# Patient Record
Sex: Female | Born: 2000 | Race: Black or African American | Hispanic: No | Marital: Single | Smoking: Never smoker
Health system: Southern US, Community
[De-identification: ages and names within clinical notes are randomized; demographics above are authoritative.]

## PROBLEM LIST (undated history)

## (undated) DIAGNOSIS — J189 Pneumonia, unspecified organism: Secondary | ICD-10-CM

## (undated) DIAGNOSIS — E876 Hypokalemia: Secondary | ICD-10-CM

---
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
## 2022-02-14 ENCOUNTER — Ambulatory Visit
Admission: RE | Admit: 2022-02-14 | Discharge: 2022-02-14 | Disposition: A | Discharge status: Home / Self Care | Payer: Medicaid Other | Source: Ambulatory Visit | Attending: Physician Assistant | Admitting: Physician Assistant

## 2022-02-14 VITALS — BP 103/70 | HR 83 | Temp 98.3°F | Resp 16

## 2022-02-14 DIAGNOSIS — N3000 Acute cystitis without hematuria: Secondary | ICD-10-CM | POA: Insufficient documentation

## 2022-02-14 DIAGNOSIS — N898 Other specified noninflammatory disorders of vagina: Secondary | ICD-10-CM

## 2022-02-14 LAB — POCT URINALYSIS DIP (MANUAL ENTRY)
Bilirubin, UA: NEGATIVE
Glucose, UA: NEGATIVE mg/dL
Ketones, POC UA: NEGATIVE mg/dL
Nitrite, UA: NEGATIVE
Protein Ur, POC: NEGATIVE mg/dL
Spec Grav, UA: 1.025 (ref 1.010–1.025)
Urobilinogen, UA: 1 E.U./dL
pH, UA: 7.5 (ref 5.0–8.0)

## 2022-02-14 LAB — POCT URINE PREGNANCY: Preg Test, Ur: NEGATIVE

## 2022-02-14 MED ORDER — NITROFURANTOIN MONOHYD MACRO 100 MG PO CAPS: 100.0000 mg | ORAL_CAPSULE | Freq: Two times a day (BID) | ORAL | 0 refills | Status: DC

## 2022-02-14 NOTE — ED Triage Notes (Signed)
Pt c/o vaginal discharge, frequency, itching   Onset ~ thurs

## 2022-02-14 NOTE — ED Provider Notes (Signed)
[facility]    CSN: [id number] Arrival date & time: 02/14/22  1417      History   Chief Complaint Chief Complaint  Patient presents with   Vaginal Itching    Entered by patient    HPI Christine Gray is a 22 y.o. female.   Patient here today for evaluation of discomfort with urination, urinary frequency, vaginal itching that started 4 days ago. She has not had any abdominal pain, nausea or vomiting but did think she has had fever on occasion. She does not report treatment for symptoms.   The history is provided by the patient.  Vaginal Itching Pertinent negatives include no abdominal pain.    Past Medical History:  Diagnosis Date   Hypokalemia    Pneumonia    Suicidal ideation 09/19/2021   Tylenol overdose 09/19/2021    Patient Active Problem List   Diagnosis Date Noted   MDD (major depressive disorder), recurrent severe, without psychosis ([place])    Tylenol overdose 09/19/2021   Suicidal ideation 09/19/2021   Hypokalemia     History reviewed. No pertinent surgical history.  OB History   No obstetric history on file.      Home Medications    Prior to Admission medications   Medication Sig Start Date End Date Taking? Authorizing Provider  nitrofurantoin, macrocrystal-monohydrate, (MACROBID) 100 MG capsule Take 1 capsule (100 mg total) by mouth 2 (two) times daily. 02/14/22   Francene Finders, PA-C  triamcinolone cream (KENALOG) 0.1 % Apply 1 Application topically daily as needed (Eczema).    [provider]    Family History Family History  Family history unknown: Yes    Social History Social History   Tobacco Use   Smoking status: Never    Passive exposure: Yes  Substance Use Topics   Alcohol use: No   Drug use: No     Allergies   Strawberry (diagnostic)   Review of Systems Review of Systems  Constitutional:  Positive for fever (subjective). Negative for chills.  Eyes:  Negative for discharge and redness.   Gastrointestinal:  Negative for abdominal pain, nausea and vomiting.  Genitourinary:  Positive for dysuria and frequency. Negative for vaginal discharge.     Physical Exam Triage Vital Signs ED Triage Vitals [02/14/22 1426]  Enc Vitals Group     BP 103/70     Pulse Rate 83     Resp 16     Temp 98.3 F (36.8 C)     Temp Source Oral     SpO2 98 %     Weight      Height      Head Circumference      Peak Flow      Pain Score 0     Pain Loc      Pain Edu?      Excl. in [place]?    No data found.  Updated Vital Signs BP 103/70 (BP Location: Left Arm)   Pulse 83   Temp 98.3 F (36.8 C) (Oral)   Resp 16   SpO2 98%      Physical Exam Vitals and nursing note reviewed.  Constitutional:      General: She is not in acute distress.    Appearance: Normal appearance. She is not ill-appearing.  HENT:     Head: Normocephalic and atraumatic.  Eyes:     Conjunctiva/sclera: Conjunctivae normal.  Cardiovascular:     Rate and Rhythm: Normal rate.  Pulmonary:     Effort:  Pulmonary effort is normal. No respiratory distress.  Neurological:     Mental Status: She is alert.  Psychiatric:        Mood and Affect: Mood normal.        Behavior: Behavior normal.        Thought Content: Thought content normal.      UC Treatments / Results  Labs (all labs ordered are listed, but only abnormal results are displayed) Labs Reviewed  POCT URINALYSIS DIP (MANUAL ENTRY) - Abnormal; Notable for the following components:      Result Value   Color, UA straw (*)    Clarity, UA cloudy (*)    Blood, UA small (*)    Leukocytes, UA Large (3+) (*)    All other components within normal limits  URINE CULTURE  POCT URINE PREGNANCY  CERVICOVAGINAL ANCILLARY ONLY    EKG   Radiology No results found.  Procedures Procedures (including critical care time)  Medications Ordered in UC Medications - No data to display  Initial Impression / Assessment and Plan / UC Course  I have reviewed the  triage vital signs and the nursing notes.  Pertinent labs & imaging results that were available during my care of the patient were reviewed by me and considered in my medical decision making (see chart for details).    UA concerning for UTI- will order urine culture and macrobid prescribed. Screening for STD, yeast and BV also ordered given vaginal symptoms. Urine pregnancy test negative. Will await results for further recommendation but did encourage follow up with any worsening or persistent symptoms.   Final Clinical Impressions(s) / UC Diagnoses   Final diagnoses:  Acute cystitis without hematuria  Vaginal itching   Discharge Instructions   None    ED Prescriptions     Medication Sig Dispense Auth. Provider   nitrofurantoin, macrocrystal-monohydrate, (MACROBID) 100 MG capsule  (Status: Discontinued) Take 1 capsule (100 mg total) by mouth 2 (two) times daily. 10 capsule Francene Finders, PA-C   nitrofurantoin, macrocrystal-monohydrate, (MACROBID) 100 MG capsule Take 1 capsule (100 mg total) by mouth 2 (two) times daily. 10 capsule Francene Finders, PA-C      PDMP not reviewed this encounter.   Francene Finders, PA-C 02/14/22 1600

## 2022-02-15 ENCOUNTER — Telehealth (HOSPITAL_COMMUNITY): Payer: Self-pay | Admitting: Emergency Medicine

## 2022-02-15 LAB — CERVICOVAGINAL ANCILLARY ONLY
Bacterial Vaginitis (gardnerella): POSITIVE — AB
Candida Glabrata: NEGATIVE
Candida Vaginitis: POSITIVE — AB
Chlamydia: NEGATIVE
Comment: NEGATIVE
Comment: NEGATIVE
Comment: NEGATIVE
Comment: NEGATIVE
Comment: NEGATIVE
Comment: NORMAL
Neisseria Gonorrhea: NEGATIVE
Trichomonas: NEGATIVE

## 2022-02-15 LAB — URINE CULTURE: Culture: 40000 — AB

## 2022-02-15 MED ORDER — METRONIDAZOLE 500 MG PO TABS: 500.0000 mg | ORAL_TABLET | Freq: Two times a day (BID) | ORAL | 0 refills | Status: DC

## 2022-02-15 MED ORDER — FLUCONAZOLE 150 MG PO TABS: 150.0000 mg | ORAL_TABLET | Freq: Once | ORAL | 0 refills | Status: AC

## 2023-02-03 ENCOUNTER — Ambulatory Visit: Payer: Self-pay

## 2023-03-27 ENCOUNTER — Other Ambulatory Visit: Payer: Self-pay

## 2023-03-27 ENCOUNTER — Emergency Department (HOSPITAL_COMMUNITY): Payer: MEDICAID

## 2023-03-27 ENCOUNTER — Encounter (HOSPITAL_COMMUNITY): Payer: Self-pay | Admitting: Emergency Medicine

## 2023-03-27 ENCOUNTER — Emergency Department (HOSPITAL_COMMUNITY)
Admission: EM | Admit: 2023-03-27 | Discharge: 2023-03-27 | Disposition: A | Discharge status: Home / Self Care | Payer: MEDICAID | Attending: Emergency Medicine | Admitting: Emergency Medicine

## 2023-03-27 DIAGNOSIS — G43009 Migraine without aura, not intractable, without status migrainosus: Secondary | ICD-10-CM | POA: Insufficient documentation

## 2023-03-27 DIAGNOSIS — R519 Headache, unspecified: Secondary | ICD-10-CM | POA: Diagnosis present

## 2023-03-27 LAB — CBC WITH DIFFERENTIAL/PLATELET
Abs Immature Granulocytes: 0.02 10*3/uL (ref 0.00–0.07)
Basophils Absolute: 0.1 10*3/uL (ref 0.0–0.1)
Basophils Relative: 1 %
Eosinophils Absolute: 0 10*3/uL (ref 0.0–0.5)
Eosinophils Relative: 1 %
HCT: 40.2 % (ref 36.0–46.0)
Hemoglobin: 14 g/dL (ref 12.0–15.0)
Immature Granulocytes: 0 %
Lymphocytes Relative: 27 %
Lymphs Abs: 1.9 10*3/uL (ref 0.7–4.0)
MCH: 30.8 pg (ref 26.0–34.0)
MCHC: 34.8 g/dL (ref 30.0–36.0)
MCV: 88.4 fL (ref 80.0–100.0)
Monocytes Absolute: 0.4 10*3/uL (ref 0.1–1.0)
Monocytes Relative: 6 %
Neutro Abs: 4.5 10*3/uL (ref 1.7–7.7)
Neutrophils Relative %: 65 %
Platelets: 326 10*3/uL (ref 150–400)
RBC: 4.55 MIL/uL (ref 3.87–5.11)
RDW: 13.2 % (ref 11.5–15.5)
WBC: 7 10*3/uL (ref 4.0–10.5)
nRBC: 0 % (ref 0.0–0.2)

## 2023-03-27 LAB — HCG, SERUM, QUALITATIVE: Preg, Serum: NEGATIVE

## 2023-03-27 LAB — BASIC METABOLIC PANEL
Anion gap: 11 (ref 5–15)
BUN: 11 mg/dL (ref 6–20)
CO2: 22 mmol/L (ref 22–32)
Calcium: 9.6 mg/dL (ref 8.9–10.3)
Chloride: 104 mmol/L (ref 98–111)
Creatinine, Ser: 0.72 mg/dL (ref 0.44–1.00)
GFR, Estimated: >60 mL/min (ref >60)
Glucose, Bld: 83 mg/dL (ref 70–99)
Potassium: 3.6 mmol/L (ref 3.5–5.1)
Sodium: 137 mmol/L (ref 135–145)

## 2023-03-27 MED ORDER — DROPERIDOL 2.5 MG/ML IJ SOLN
2.5000 mg | Freq: Once | INTRAMUSCULAR | Status: AC
  Administered 2023-03-27: 2.5 mg via INTRAVENOUS
  Filled 2023-03-27: qty 2
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
## 2023-03-27 NOTE — Discharge Instructions (Addendum)
 While you are in the emergency room he had blood work done that was normal.  You do CT scan done of your head that was also normal.  Please follow-up with your primary care doctor to discuss further management of headache.
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
## 2023-06-21 ENCOUNTER — Ambulatory Visit (HOSPITAL_COMMUNITY)
Admission: EM | Admit: 2023-06-21 | Discharge: 2023-06-21 | Disposition: A | Discharge status: Home / Self Care | Payer: MEDICAID | Attending: Internal Medicine | Admitting: Internal Medicine

## 2023-06-21 ENCOUNTER — Encounter (HOSPITAL_COMMUNITY): Payer: Self-pay

## 2023-06-21 DIAGNOSIS — M19042 Primary osteoarthritis, left hand: Secondary | ICD-10-CM | POA: Diagnosis not present

## 2023-06-21 MED ORDER — PREDNISONE 10 MG PO TABS: ORAL_TABLET | ORAL | 0 refills | Status: AC

## 2023-06-21 NOTE — ED Provider Notes (Signed)
 [facility]    CSN: [id number] Arrival date & time: 06/21/23  1952      History   Chief Complaint Chief Complaint  Patient presents with   Hand Pain    Left index finger    HPI Christine Gray is a 23 y.o. female.   22 y.o. female who presents to urgent care with complaints of left index finger pain and swelling.  This started this morning.  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  She woke up this morning with decreased range of motion, pain and swelling in the left index finger.  There is no color change or warmth to the area.  She has not had any injury to the area.  She has had no previous issues with this finger in the past.  She is right-handed.  She denies fevers or chills.  She can only partially bend her finger at the proximal joint.  Most the pain is isolated to the mid to distal finger.  She denies any history of gout.    Hand Pain Pertinent negatives include no chest pain, no abdominal pain and no shortness of breath.    Past Medical History:  Diagnosis Date   Hypokalemia    Pneumonia    Suicidal ideation 09/19/2021   Tylenol  overdose 09/19/2021    Patient Active Problem List   Diagnosis Date Noted   MDD (major depressive disorder), recurrent severe, without psychosis ([facility])    Tylenol  overdose 09/19/2021   Suicidal ideation 09/19/2021   Hypokalemia     History reviewed. No pertinent surgical history.  OB History   No obstetric history on file.      Home Medications    Prior to Admission medications   Medication Sig Start Date End Date Taking? Authorizing Provider  predniSONE (DELTASONE) 10 MG tablet Take 6 tablets (60 mg total) by mouth daily with breakfast for 1 day, THEN 5 tablets (50 mg total) daily with breakfast for 1 day, THEN 4 tablets (40 mg total) daily with breakfast for 1 day, THEN 3 tablets (30 mg total) daily with breakfast for 1 day, THEN 2 tablets (20  mg total) daily with breakfast for 1 day, THEN 1 tablet (10 mg total) daily with breakfast for 1 day. 06/21/23 06/27/23 Yes Minnah Llamas A, PA-C  triamcinolone cream (KENALOG) 0.1 % Apply 1 Application topically daily as needed (Eczema).    [provider]    Family History Family History  Family history unknown: Yes    Social History Social History   Tobacco Use   Smoking status: Never    Passive exposure: Yes  Vaping Use   Vaping status: Every Day  Substance Use Topics   Alcohol use: Yes    Comment: socially   Drug use: No     Allergies   Strawberry (diagnostic)   Review of Systems Review of Systems  Constitutional:  Negative for chills and fever.  HENT:  Negative for ear pain and sore throat.   Eyes:  Negative for pain and visual disturbance.  Respiratory:  Negative for cough and shortness of breath.   Cardiovascular:  Negative for chest pain and palpitations.  Gastrointestinal:  Negative for abdominal pain and vomiting.  Genitourinary:  Negative for dysuria and hematuria.  Musculoskeletal:  Negative for arthralgias and back pain.       Left index finger pain and swelling and decreased ROM  Skin:  Negative for color change and rash.  Neurological:  Negative for seizures and syncope.  All other systems reviewed and are negative.    Physical Exam Triage Vital Signs ED Triage Vitals  Encounter Vitals Group     BP 06/21/23 2021 114/74     Systolic BP Percentile --      Diastolic BP Percentile --      Pulse Rate 06/21/23 2021 81     Resp 06/21/23 2021 16     Temp 06/21/23 2021 98.7 F (37.1 C)     Temp Source 06/21/23 2021 Oral     SpO2 06/21/23 2021 99 %     Weight --      Height --      Head Circumference --      Peak Flow --      Pain Score 06/21/23 2022 9     Pain Loc --      Pain Education --      Exclude from Growth Chart --    No data found.  Updated Vital Signs BP 114/74 (BP Location: Left Arm)   Pulse 81   Temp 98.7 F (37.1  C) (Oral)   Resp 16   LMP 05/30/2023 (Exact Date)   SpO2 99%   Visual Acuity Right Eye Distance:   Left Eye Distance:   Bilateral Distance:    Right Eye Near:   Left Eye Near:    Bilateral Near:     Physical Exam Vitals and nursing note reviewed.  Constitutional:      General: She is not in acute distress.    Appearance: She is well-developed.  HENT:     Head: Normocephalic and atraumatic.  Eyes:     Conjunctiva/sclera: Conjunctivae normal.  Cardiovascular:     Rate and Rhythm: Normal rate.     Heart sounds: No murmur heard. Pulmonary:     Effort: Pulmonary effort is normal. No respiratory distress.  Abdominal:     Palpations: Abdomen is soft.     Tenderness: There is no abdominal tenderness.  Musculoskeletal:        General: No swelling.     Left hand: Decreased range of motion (left index finger at the MIP and DIP). Normal strength. Normal capillary refill. Normal pulse.     Cervical back: Neck supple.     Comments: Mild swelling of the DIP and MIP but no erythema.  Skin:    General: Skin is warm and dry.     Capillary Refill: Capillary refill takes less than 2 seconds.  Neurological:     Mental Status: She is alert.  Psychiatric:        Mood and Affect: Mood normal.      UC Treatments / Results  Labs (all labs ordered are listed, but only abnormal results are displayed) Labs Reviewed - No data to display  EKG   Radiology No results found.  Procedures Procedures (including critical care time)  Medications Ordered in UC Medications - No data to display  Initial Impression / Assessment and Plan / UC Course  I have reviewed the triage vital signs and the nursing notes.  Pertinent labs & imaging results that were available during my care of the patient were reviewed by me and considered in my medical decision making (see chart for details).     Inflammation of joint of finger, left   Left index finger inflammation that could be secondary to  overactivity or possibly trigger finger.  More likely to be  inflammation within the joint.  We can try to treat this with anti-inflammatories and if it does not improve may need to consider follow-up with a hand specialist.  Do not believe this is infectious in nature.  We will treat with the following: Prednisone taper, 60mg  (6 pills) on day 1 , 50mg  (5 pills) on day 2, 40mg  (4 pills) on day 3, 30mg  (3 pills) on day 4 and 20mg  (2 pill) on day 5, 10mg  (1 pill) on day 6. Avoid heavy activity with the left index finger for the next 2-3 days.  If symptoms fail to improve or resolve completely, then may need to see a hand specialist but can return to urgent care for further evaluation.   Final Clinical Impressions(s) / UC Diagnoses   Final diagnoses:  Inflammation of joint of finger, left   Discharge Instructions      Left index finger inflammation that could be secondary to overactivity or possibly trigger finger.  More likely to be inflammation within the joint.  We can try to treat this with anti-inflammatories and if it does not improve may need to consider follow-up with a hand specialist.  Do not believe this is infectious in nature.  We will treat with the following: Prednisone taper, 60mg  (6 pills) on day 1 , 50mg  (5 pills) on day 2, 40mg  (4 pills) on day 3, 30mg  (3 pills) on day 4 and 20mg  (2 pill) on day 5, 10mg  (1 pill) on day 6. Avoid heavy activity with the left index finger for the next 2-3 days.  If symptoms fail to improve or resolve completely, then may need to see a hand specialist but can return to urgent care for further evaluation.   ED Prescriptions     Medication Sig Dispense Auth. Provider   predniSONE (DELTASONE) 10 MG tablet Take 6 tablets (60 mg total) by mouth daily with breakfast for 1 day, THEN 5 tablets (50 mg total) daily with breakfast for 1 day, THEN 4 tablets (40 mg total) daily with breakfast for 1 day, THEN 3 tablets (30 mg total) daily with breakfast for 1 day,  THEN 2 tablets (20 mg total) daily with breakfast for 1 day, THEN 1 tablet (10 mg total) daily with breakfast for 1 day. 21 tablet Kreg Pesa, PA-C      PDMP not reviewed this encounter.   Kreg Pesa, PA-C 06/21/23 2102

## 2023-06-21 NOTE — Discharge Instructions (Addendum)
 Left index finger inflammation that could be secondary to overactivity or possibly trigger finger.  More likely to be inflammation within the joint.  We can try to treat this with anti-inflammatories and if it does not improve may need to consider follow-up with a hand specialist.  Do not believe this is infectious in nature.  We will treat with the following: Prednisone taper, 60mg  (6 pills) on day 1 , 50mg  (5 pills) on day 2, 40mg  (4 pills) on day 3, 30mg  (3 pills) on day 4 and 20mg  (2 pill) on day 5, 10mg  (1 pill) on day 6. Avoid heavy activity with the left index finger for the next 2-3 days.  If symptoms fail to improve or resolve completely, then may need to see a hand specialist but can return to urgent care for further evaluation.

## 2023-06-21 NOTE — ED Triage Notes (Signed)
 

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
